# Patient Record
Sex: Male | Born: 1975 | Race: Black or African American | Hispanic: No | Marital: Married | Smoking: Former smoker
Health system: Southern US, Community
[De-identification: ages and names within clinical notes are randomized; demographics above are authoritative.]

## PROBLEM LIST (undated history)

## (undated) DIAGNOSIS — K219 Gastro-esophageal reflux disease without esophagitis: Secondary | ICD-10-CM

## (undated) HISTORY — PX: SHOULDER SURGERY: SHX246

## (undated) HISTORY — PX: BACK SURGERY: SHX140

---
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
## 1999-02-13 ENCOUNTER — Emergency Department (HOSPITAL_COMMUNITY): Admission: EM | Admit: 1999-02-13 | Discharge: 1999-02-13 | Payer: Self-pay | Admitting: Emergency Medicine

## 2000-11-13 ENCOUNTER — Encounter: Payer: Self-pay | Admitting: Family Medicine

## 2000-11-13 ENCOUNTER — Ambulatory Visit (HOSPITAL_COMMUNITY): Admission: RE | Admit: 2000-11-13 | Discharge: 2000-11-13 | Payer: Self-pay | Admitting: Family Medicine

## 2001-01-15 ENCOUNTER — Ambulatory Visit (HOSPITAL_COMMUNITY): Admission: RE | Admit: 2001-01-15 | Discharge: 2001-01-15 | Payer: Self-pay | Admitting: Cardiology

## 2001-06-15 ENCOUNTER — Emergency Department (HOSPITAL_COMMUNITY): Admission: EM | Admit: 2001-06-15 | Discharge: 2001-06-16 | Payer: Self-pay | Admitting: Emergency Medicine

## 2001-08-17 ENCOUNTER — Emergency Department (HOSPITAL_COMMUNITY): Admission: EM | Admit: 2001-08-17 | Discharge: 2001-08-18 | Payer: Self-pay

## 2001-08-18 ENCOUNTER — Encounter: Payer: Self-pay | Admitting: Emergency Medicine

## 2001-11-15 ENCOUNTER — Emergency Department (HOSPITAL_COMMUNITY): Admission: EM | Admit: 2001-11-15 | Discharge: 2001-11-16 | Payer: Self-pay | Admitting: *Deleted

## 2002-03-15 ENCOUNTER — Emergency Department (HOSPITAL_COMMUNITY): Admission: EM | Admit: 2002-03-15 | Discharge: 2002-03-16 | Payer: Self-pay | Admitting: Emergency Medicine

## 2002-03-15 ENCOUNTER — Encounter: Payer: Self-pay | Admitting: Emergency Medicine

## 2002-06-28 ENCOUNTER — Encounter: Payer: Self-pay | Admitting: Emergency Medicine

## 2002-06-28 ENCOUNTER — Emergency Department (HOSPITAL_COMMUNITY): Admission: EM | Admit: 2002-06-28 | Discharge: 2002-06-29 | Payer: Self-pay | Admitting: Emergency Medicine

## 2002-06-29 ENCOUNTER — Encounter: Payer: Self-pay | Admitting: Emergency Medicine

## 2002-07-14 ENCOUNTER — Emergency Department (HOSPITAL_COMMUNITY): Admission: EM | Admit: 2002-07-14 | Discharge: 2002-07-14 | Payer: Self-pay | Admitting: Emergency Medicine

## 2002-08-12 ENCOUNTER — Emergency Department (HOSPITAL_COMMUNITY): Admission: EM | Admit: 2002-08-12 | Discharge: 2002-08-12 | Payer: Self-pay | Admitting: Emergency Medicine

## 2004-07-16 ENCOUNTER — Emergency Department (HOSPITAL_COMMUNITY): Admission: EM | Admit: 2004-07-16 | Discharge: 2004-07-16 | Payer: Self-pay | Admitting: Emergency Medicine

## 2004-12-24 ENCOUNTER — Emergency Department (HOSPITAL_COMMUNITY): Admission: EM | Admit: 2004-12-24 | Discharge: 2004-12-24 | Payer: Self-pay | Admitting: Emergency Medicine

## 2005-05-06 ENCOUNTER — Emergency Department (HOSPITAL_COMMUNITY): Admission: EM | Admit: 2005-05-06 | Discharge: 2005-05-06 | Payer: Self-pay | Admitting: Emergency Medicine

## 2006-01-31 ENCOUNTER — Emergency Department (HOSPITAL_COMMUNITY): Admission: EM | Admit: 2006-01-31 | Discharge: 2006-01-31 | Payer: Self-pay | Admitting: Emergency Medicine

## 2006-12-31 ENCOUNTER — Emergency Department (HOSPITAL_COMMUNITY): Admission: EM | Admit: 2006-12-31 | Discharge: 2006-12-31 | Payer: Self-pay | Admitting: Emergency Medicine

## 2007-07-15 ENCOUNTER — Emergency Department (HOSPITAL_COMMUNITY): Admission: EM | Admit: 2007-07-15 | Discharge: 2007-07-15 | Payer: Self-pay | Admitting: Emergency Medicine

## 2008-05-02 ENCOUNTER — Emergency Department (HOSPITAL_COMMUNITY): Admission: EM | Admit: 2008-05-02 | Discharge: 2008-05-02 | Payer: Self-pay | Admitting: Emergency Medicine

## 2008-05-27 ENCOUNTER — Emergency Department (HOSPITAL_COMMUNITY): Admission: EM | Admit: 2008-05-27 | Discharge: 2008-05-27 | Payer: Self-pay | Admitting: Emergency Medicine

## 2009-05-02 ENCOUNTER — Emergency Department (HOSPITAL_COMMUNITY): Admission: EM | Admit: 2009-05-02 | Discharge: 2009-05-02 | Payer: Self-pay | Admitting: Emergency Medicine

## 2009-05-05 ENCOUNTER — Emergency Department (HOSPITAL_COMMUNITY): Admission: EM | Admit: 2009-05-05 | Discharge: 2009-05-05 | Payer: Self-pay | Admitting: Emergency Medicine

## 2009-07-02 ENCOUNTER — Emergency Department (HOSPITAL_COMMUNITY): Admission: EM | Admit: 2009-07-02 | Discharge: 2009-07-03 | Payer: Self-pay | Admitting: Emergency Medicine

## 2009-11-16 ENCOUNTER — Emergency Department (HOSPITAL_COMMUNITY): Admission: EM | Admit: 2009-11-16 | Discharge: 2009-11-16 | Payer: Self-pay | Admitting: Emergency Medicine

## 2010-04-12 ENCOUNTER — Emergency Department (HOSPITAL_COMMUNITY)
Admission: EM | Admit: 2010-04-12 | Discharge: 2010-04-12 | Payer: Self-pay | Source: Home / Self Care | Admitting: Emergency Medicine

## 2010-07-01 LAB — BASIC METABOLIC PANEL
BUN: 12 mg/dL (ref 6–23)
Creatinine, Ser: 1.16 mg/dL (ref 0.4–1.5)
GFR calc Af Amer: >60 mL/min (ref >60)
Glucose, Bld: 103 mg/dL — ABNORMAL HIGH (ref 70–99)
Potassium: 4.1 mEq/L (ref 3.5–5.1)
Sodium: 140 mEq/L (ref 135–145)

## 2010-07-01 LAB — POCT CARDIAC MARKERS
CKMB, poc: <1 ng/mL — ABNORMAL LOW (ref 1.0–8.0)
Myoglobin, poc: 102 ng/mL (ref 12–200)
Myoglobin, poc: 127 ng/mL (ref 12–200)

## 2010-07-01 LAB — PROTIME-INR
INR: 1.08 (ref 0.00–1.49)
Prothrombin Time: 13.9 seconds (ref 11.6–15.2)

## 2010-07-01 LAB — CBC
HCT: 43.5 % (ref 39.0–52.0)
Hemoglobin: 13.3 g/dL (ref 13.0–17.0)
Hemoglobin: 13.8 g/dL (ref 13.0–17.0)
MCHC: 31.6 g/dL (ref 30.0–36.0)
Platelets: 251 10*3/uL (ref 150–400)
RBC: 5.66 MIL/uL (ref 4.22–5.81)
RBC: 5.82 MIL/uL — ABNORMAL HIGH (ref 4.22–5.81)
WBC: 7.2 10*3/uL (ref 4.0–10.5)

## 2010-07-01 LAB — POCT I-STAT, CHEM 8
Creatinine, Ser: 1.2 mg/dL (ref 0.4–1.5)
HCT: 48 % (ref 39.0–52.0)
Hemoglobin: 16.3 g/dL (ref 13.0–17.0)
Potassium: 4 mEq/L (ref 3.5–5.1)
Sodium: 140 mEq/L (ref 135–145)
TCO2: 26 mmol/L (ref 0–100)

## 2010-07-01 LAB — DIFFERENTIAL
Basophils Absolute: 0 10*3/uL (ref 0.0–0.1)
Basophils Absolute: 0.1 10*3/uL (ref 0.0–0.1)
Eosinophils Absolute: 0.2 10*3/uL (ref 0.0–0.7)
Eosinophils Relative: 4 % (ref 0–5)
Lymphs Abs: 1.9 10*3/uL (ref 0.7–4.0)
Lymphs Abs: 2.7 10*3/uL (ref 0.7–4.0)
Monocytes Relative: 12 % (ref 3–12)

## 2010-07-01 LAB — COMPREHENSIVE METABOLIC PANEL
ALT: 30 U/L (ref 0–53)
Albumin: 3.9 g/dL (ref 3.5–5.2)
CO2: 27 mEq/L (ref 19–32)
Calcium: 8.9 mg/dL (ref 8.4–10.5)
Creatinine, Ser: 1.31 mg/dL (ref 0.4–1.5)
Glucose, Bld: 103 mg/dL — ABNORMAL HIGH (ref 70–99)
Potassium: 3.9 mEq/L (ref 3.5–5.1)
Total Protein: 7 g/dL (ref 6.0–8.3)

## 2010-07-01 LAB — APTT: aPTT: 26 seconds (ref 24–37)

## 2010-07-01 LAB — GLUCOSE, CAPILLARY: Glucose-Capillary: 111 mg/dL — ABNORMAL HIGH (ref 70–99)

## 2010-07-31 LAB — POCT CARDIAC MARKERS
Myoglobin, poc: 49.6 ng/mL (ref 12–200)
Myoglobin, poc: 69.2 ng/mL (ref 12–200)

## 2011-01-08 LAB — POCT CARDIAC MARKERS
CKMB, poc: <1 — ABNORMAL LOW
Myoglobin, poc: 63.8
Operator id: 270651

## 2011-01-08 LAB — POCT I-STAT, CHEM 8
Creatinine, Ser: 1.5
Glucose, Bld: 101 — ABNORMAL HIGH
HCT: 48
Hemoglobin: 16.3
Sodium: 140
TCO2: 27

## 2011-02-18 ENCOUNTER — Encounter (INDEPENDENT_AMBULATORY_CARE_PROVIDER_SITE_OTHER): Payer: Self-pay | Admitting: General Surgery

## 2011-02-18 ENCOUNTER — Ambulatory Visit (INDEPENDENT_AMBULATORY_CARE_PROVIDER_SITE_OTHER): Payer: PRIVATE HEALTH INSURANCE | Admitting: General Surgery

## 2011-02-18 VITALS — BP 144/82 | HR 76 | Temp 97.6°F | Resp 14 | Ht 72.0 in | Wt 328.6 lb

## 2011-02-18 DIAGNOSIS — L723 Sebaceous cyst: Secondary | ICD-10-CM

## 2011-02-18 DIAGNOSIS — L089 Local infection of the skin and subcutaneous tissue, unspecified: Secondary | ICD-10-CM | POA: Insufficient documentation

## 2011-02-18 NOTE — Progress Notes (Signed)
Subjective:     Patient ID: Anthony Chaney, male   DOB: [birth year], 35 y.o.   MRN: [id number]  HPI We are asked to see the patient in consultation by Dr. Velna Hatchet to evaluate him for an infected sebaceous cyst on his back. The patient is a 59 her black male who recently had a cyst on his back a very large. It started to drain multicolored fluid and material. It had a bad smell to it. He was treated with antibiotics which improved it but it has not completely gone away. He denies any fevers or chills. he is still having some drainage.  Review of Systems  Constitutional: Negative.   HENT: Negative.   Eyes: Negative.   Respiratory: Negative.   Cardiovascular: Negative.   Gastrointestinal: Negative.   Genitourinary: Negative.   Musculoskeletal: Negative.   Skin: Negative.   Neurological: Negative.   Hematological: Negative.   Psychiatric/Behavioral: Negative.    Past Medical History  Diagnosis Date  . Diabetes mellitus     type 2   Past Surgical History  Procedure Date  . Shoulder surgery 04/1996    left - arthro   Current Outpatient Prescriptions  Medication Sig Dispense Refill  . metFORMIN (GLUCOPHAGE) 500 MG tablet Take 500 mg by mouth daily.         Allergies  Allergen Reactions  . Sulfur Hives and Rash    All over the body  . Aspirin Other (See Comments)    Patient does not remember reaction.       Objective:   Physical Exam  Constitutional: He is oriented to person, place, and time. He appears well-developed and well-nourished.  HENT:  Head: Normocephalic and atraumatic.  Eyes: Conjunctivae and EOM are normal. Pupils are equal, round, and reactive to light.  Neck: Neck supple.  Cardiovascular: Normal rate, regular rhythm and normal heart sounds.   Pulmonary/Chest: Effort normal and breath sounds normal.       The patient has an obvious large sebaceous cyst on his back. There is no cellulitis today. It has been recently infected  Abdominal: Soft. Bowel  sounds are normal.  Musculoskeletal: Normal range of motion.  Neurological: He is alert and oriented to person, place, and time.  Skin: Skin is warm and dry.  Psychiatric: He has a normal mood and affect. His behavior is normal.       Assessment:     Recently infected sebaceous cyst on the back.    Plan:     I think the chance of this continuing to drain is pretty high. I think he would benefit from having this area incised and drained. I have discussed with him in detail the risks and benefits of the operation as well as some of the technical aspects and he understands and wishes to proceed. I will plan to leave the area open with packing and he can do dressing changes daily at home.

## 2011-02-21 ENCOUNTER — Other Ambulatory Visit (INDEPENDENT_AMBULATORY_CARE_PROVIDER_SITE_OTHER): Payer: Self-pay | Admitting: General Surgery

## 2011-02-22 DIAGNOSIS — L02219 Cutaneous abscess of trunk, unspecified: Secondary | ICD-10-CM

## 2011-02-22 DIAGNOSIS — L03319 Cellulitis of trunk, unspecified: Secondary | ICD-10-CM

## 2011-02-23 ENCOUNTER — Telehealth (INDEPENDENT_AMBULATORY_CARE_PROVIDER_SITE_OTHER): Payer: Self-pay | Admitting: General Surgery

## 2011-02-23 NOTE — Telephone Encounter (Signed)
I spoke with [name] about Anthony Chaney. He had incision and drainage of an abscess yesterday by Dr. Carolynne Edouard. They were told the nurses may come out today and change of dressing but they have not been called by the home health nurses. I told her if the nurses did not come out this weekend  to call Monday and we will do the first dressing change and I thought it was okay to do the first dressing change on Monday.

## 2011-03-25 ENCOUNTER — Ambulatory Visit (INDEPENDENT_AMBULATORY_CARE_PROVIDER_SITE_OTHER): Payer: PRIVATE HEALTH INSURANCE | Admitting: General Surgery

## 2011-03-25 ENCOUNTER — Encounter (INDEPENDENT_AMBULATORY_CARE_PROVIDER_SITE_OTHER): Payer: Self-pay | Admitting: General Surgery

## 2011-03-25 VITALS — BP 140/80 | HR 60 | Temp 98.2°F | Resp 12 | Ht 73.0 in | Wt 331.8 lb

## 2011-03-25 DIAGNOSIS — L723 Sebaceous cyst: Secondary | ICD-10-CM

## 2011-03-25 DIAGNOSIS — L089 Local infection of the skin and subcutaneous tissue, unspecified: Secondary | ICD-10-CM

## 2011-03-25 NOTE — Patient Instructions (Signed)
Continue daily shower and dressing change 

## 2011-03-25 NOTE — Progress Notes (Signed)
Subjective:     Patient ID: Anthony Chaney, male   DOB: [birth year], 35 y.o.   MRN: [id number]  HPI The patient is a 35 year old black male who is now one month out from an incision and drainage of an infected sebaceous cyst on the back. His family has been doing his dressing changes and things seemed to be doing well. He has no complaints today. He does not have any drainage. He's not having any pain.  Review of Systems     Objective:   Physical Exam On exam the wound is very clean and fairly shallow. There is good granulation tissue and no sign of infection    Assessment:     One month out from an incision and drainage of an infected sebaceous cyst on the back    Plan:     The patient will continue daily showering and dressing changes. We will see him back in about 3 weeks for a wound check

## 2011-04-06 ENCOUNTER — Encounter (HOSPITAL_BASED_OUTPATIENT_CLINIC_OR_DEPARTMENT_OTHER): Payer: Self-pay | Admitting: *Deleted

## 2011-04-06 ENCOUNTER — Emergency Department (HOSPITAL_BASED_OUTPATIENT_CLINIC_OR_DEPARTMENT_OTHER)
Admission: EM | Admit: 2011-04-06 | Discharge: 2011-04-07 | Disposition: A | Discharge status: Home / Self Care | Payer: PRIVATE HEALTH INSURANCE | Attending: Emergency Medicine | Admitting: Emergency Medicine

## 2011-04-06 DIAGNOSIS — R51 Headache: Secondary | ICD-10-CM | POA: Insufficient documentation

## 2011-04-06 DIAGNOSIS — E119 Type 2 diabetes mellitus without complications: Secondary | ICD-10-CM | POA: Insufficient documentation

## 2011-04-06 DIAGNOSIS — R5381 Other malaise: Secondary | ICD-10-CM | POA: Insufficient documentation

## 2011-04-06 DIAGNOSIS — J111 Influenza due to unidentified influenza virus with other respiratory manifestations: Secondary | ICD-10-CM | POA: Insufficient documentation

## 2011-04-06 HISTORY — DX: Gastro-esophageal reflux disease without esophagitis: K21.9

## 2011-04-06 NOTE — ED Notes (Signed)
Pt reports night sweats, fatigue, headache, sore throat x 5 days- denies cough- states blood sugar has not been running high

## 2011-04-07 MED ORDER — HYDROCODONE-ACETAMINOPHEN 5-325 MG PO TABS: 1.0000 | ORAL_TABLET | Freq: Four times a day (QID) | ORAL | Status: AC | PRN

## 2011-04-07 NOTE — ED Provider Notes (Addendum)
History   This chart was scribed for Hanley Seamen, MD scribed by Magnus Sinning. The patient was seen in room MH08/MH08    CSN: [id number]  Arrival date & time 04/06/11  2147   First MD Initiated Contact with Patient 04/07/11 0016      Chief Complaint  Patient presents with  . Headache  . Fatigue    (Consider location/radiation/quality/duration/timing/severity/associated sxs/prior treatment) HPI Anthony Chaney is a 35 y.o. male who presents to the Emergency Department complaining of 5 days of moderate headaches w/ associated neck pain, lethargy, sore throat, myalgias of his hips, nausea but no vomiting, diarrhea, otalgia of the right ear, runny nose, and night sweats onset 5 days ago. He describes his ST as the feeling of having a  "big ball in throat. Pt denies any fever, coughing or shortness of breath.    Past Medical History  Diagnosis Date  . Diabetes mellitus     type 2  . Acid reflux     Past Surgical History  Procedure Date  . Shoulder surgery 04/1996    left - arthro  . Shoulder surgery   . Back surgery     No family history on file.  History  Substance Use Topics  . Smoking status: Former Smoker    Quit date: 09/17/2009  . Smokeless tobacco: Never Used  . Alcohol Use: No     Review of Systems  Constitutional:       Lethargic feeling and night sweats  HENT: Positive for ear pain, sore throat, rhinorrhea and neck pain.   Gastrointestinal: Positive for nausea and diarrhea. Negative for vomiting.  Musculoskeletal:       Describes hips are achy   Neurological: Positive for headaches.  All other systems reviewed and are negative.    Allergies  Sulfur and Aspirin  Home Medications   Current Outpatient Rx  Name Route Sig Dispense Refill  . METFORMIN HCL 500 MG PO TABS Oral Take 500 mg by mouth daily.        BP 122/70  Pulse 70  Temp(Src) 98.2 F (36.8 C) (Oral)  Resp 20  Ht 6\' 1"  (1.854 m)  Wt 325 lb (147.419 kg)  BMI 42.88 kg/m2  SpO2  100%  Physical Exam  Nursing note and vitals reviewed. Constitutional: He is oriented to person, place, and time. He appears well-developed and well-nourished. No distress.  HENT:  Head: Normocephalic and atraumatic.  Right Ear: External ear normal.  Left Ear: External ear normal.  Eyes: Pupils are equal, round, and reactive to light.  Neck: Normal range of motion. Neck supple. No tracheal deviation present.  Cardiovascular: Normal rate, regular rhythm and intact distal pulses.   Pulmonary/Chest: Effort normal and breath sounds normal. No respiratory distress.  Abdominal: Soft. Normal appearance and bowel sounds are normal. He exhibits no distension.  Musculoskeletal: Normal range of motion.  Neurological: He is alert and oriented to person, place, and time. No cranial nerve deficit.  Skin: Skin is warm and dry.  Psychiatric: He has a normal mood and affect. His behavior is normal.    ED Course  Procedures (including critical care time) DIAGNOSTIC STUDIES: Oxygen Saturation is 100% on room air, normal by my interpretation.     MDM   Nursing notes and vitals signs, including pulse oximetry, reviewed.  Summary of this visit's results, reviewed by myself:  Labs:  Results for orders placed during the hospital encounter of 04/06/11  RAPID STREP SCREEN  Component Value Range   Streptococcus, Group A Screen (Direct) NEGATIVE  NEGATIVE     I personally performed the services described in this documentation, which was scribed in my presence.  The recorded information has been reviewed and considered.  1:32 AM Symptomatology consistent with viral syndrome. It is noted that the son, who accompanies him, tested positive for strep throat.         Hanley Seamen, MD 04/07/11 0126  Hanley Seamen, MD 04/07/11 [phone]

## 2011-04-15 ENCOUNTER — Encounter (INDEPENDENT_AMBULATORY_CARE_PROVIDER_SITE_OTHER): Payer: PRIVATE HEALTH INSURANCE | Admitting: General Surgery

## 2011-04-22 ENCOUNTER — Encounter (INDEPENDENT_AMBULATORY_CARE_PROVIDER_SITE_OTHER): Payer: Self-pay | Admitting: General Surgery

## 2011-04-23 ENCOUNTER — Emergency Department (HOSPITAL_COMMUNITY): Payer: PRIVATE HEALTH INSURANCE

## 2011-04-23 ENCOUNTER — Encounter (HOSPITAL_COMMUNITY): Payer: Self-pay

## 2011-04-23 ENCOUNTER — Emergency Department (HOSPITAL_COMMUNITY)
Admission: EM | Admit: 2011-04-23 | Discharge: 2011-04-23 | Disposition: A | Discharge status: Home / Self Care | Payer: PRIVATE HEALTH INSURANCE | Attending: Emergency Medicine | Admitting: Emergency Medicine

## 2011-04-23 DIAGNOSIS — R10819 Abdominal tenderness, unspecified site: Secondary | ICD-10-CM | POA: Insufficient documentation

## 2011-04-23 DIAGNOSIS — R319 Hematuria, unspecified: Secondary | ICD-10-CM | POA: Insufficient documentation

## 2011-04-23 DIAGNOSIS — E119 Type 2 diabetes mellitus without complications: Secondary | ICD-10-CM | POA: Insufficient documentation

## 2011-04-23 DIAGNOSIS — R110 Nausea: Secondary | ICD-10-CM | POA: Insufficient documentation

## 2011-04-23 DIAGNOSIS — K219 Gastro-esophageal reflux disease without esophagitis: Secondary | ICD-10-CM | POA: Insufficient documentation

## 2011-04-23 DIAGNOSIS — N390 Urinary tract infection, site not specified: Secondary | ICD-10-CM | POA: Insufficient documentation

## 2011-04-23 DIAGNOSIS — R350 Frequency of micturition: Secondary | ICD-10-CM | POA: Insufficient documentation

## 2011-04-23 DIAGNOSIS — R109 Unspecified abdominal pain: Secondary | ICD-10-CM | POA: Insufficient documentation

## 2011-04-23 LAB — URINE CULTURE: Culture  Setup Time: 201301081339

## 2011-04-23 LAB — URINE MICROSCOPIC-ADD ON

## 2011-04-23 LAB — URINALYSIS, ROUTINE W REFLEX MICROSCOPIC
Glucose, UA: NEGATIVE mg/dL
Ketones, ur: NEGATIVE mg/dL
Protein, ur: 100 mg/dL — AB

## 2011-04-23 LAB — POCT I-STAT, CHEM 8
BUN: 8 mg/dL (ref 6–23)
Calcium, Ion: 1.23 mmol/L (ref 1.12–1.32)
Creatinine, Ser: 1.2 mg/dL (ref 0.50–1.35)
Glucose, Bld: 90 mg/dL (ref 70–99)
TCO2: 29 mmol/L (ref 0–100)

## 2011-04-23 MED ORDER — OXYCODONE-ACETAMINOPHEN 5-325 MG PO TABS
ORAL_TABLET | ORAL | Status: AC
  Filled 2011-04-23: qty 1

## 2011-04-23 MED ORDER — PHENAZOPYRIDINE HCL 200 MG PO TABS: 200.0000 mg | ORAL_TABLET | Freq: Three times a day (TID) | ORAL | Status: AC

## 2011-04-23 MED ORDER — OXYCODONE-ACETAMINOPHEN 5-325 MG PO TABS: 1.0000 | ORAL_TABLET | Freq: Once | ORAL | Status: AC

## 2011-04-23 MED ORDER — CIPROFLOXACIN HCL 500 MG PO TABS: 500.0000 mg | ORAL_TABLET | Freq: Two times a day (BID) | ORAL | Status: AC

## 2011-04-23 MED ORDER — OXYCODONE-ACETAMINOPHEN 5-325 MG PO TABS
1.0000 | ORAL_TABLET | Freq: Once | ORAL | Status: AC
  Administered 2011-04-23: 1 via ORAL

## 2011-04-23 MED ORDER — HYDROCODONE-ACETAMINOPHEN 5-325 MG PO TABS: 1.0000 | ORAL_TABLET | Freq: Four times a day (QID) | ORAL | Status: DC | PRN

## 2011-04-23 MED ORDER — FLUCONAZOLE 150 MG PO TABS
150.0000 mg | ORAL_TABLET | Freq: Once | ORAL | Status: AC
  Administered 2011-04-23: 150 mg via ORAL
  Filled 2011-04-23 (×2): qty 1

## 2011-04-23 NOTE — ED Provider Notes (Signed)
Medical screening examination/treatment/procedure(s) were performed by non-physician practitioner and as supervising physician I was immediately available for consultation/collaboration.   Isabella Roemmich, MD 04/23/11 2045 

## 2011-04-23 NOTE — ED Provider Notes (Signed)
History     CSN: [id number]  Arrival date & time 04/23/11  1151   First MD Initiated Contact with Patient 04/23/11 1239      Chief Complaint  Patient presents with  . Hematuria    (Consider location/radiation/quality/duration/timing/severity/associated sxs/prior treatment) HPI Comments: Patient reports painful hematuria, urinary frequency, and left flank pain that began 2 days ago.  Associated nausea without vomiting. States the pain with urination is in his penis.  Pain in his left flank occasionally radiates to right side of back.  Denies fevers, other abnormal discharge from the penis, testicular swelling or pain, rectal pain.  Pt has a hx of UTI, states this is much worse.  Denies hx kidney stones.  Has never seen a urologist.    Patient is a 36 y.o. male presenting with hematuria. The history is provided by the patient and the spouse.  Hematuria The current episode started in the past 7 days. He describes the hematuria as gross hematuria. Irritative symptoms include frequency.    Past Medical History  Diagnosis Date  . Diabetes mellitus     type 2  . Acid reflux     Past Surgical History  Procedure Date  . Shoulder surgery 04/1996    left - arthro  . Shoulder surgery   . Back surgery     No family history on file.  History  Substance Use Topics  . Smoking status: Former Smoker    Quit date: 09/17/2009  . Smokeless tobacco: Never Used  . Alcohol Use: No      Review of Systems  Genitourinary: Positive for frequency and hematuria.  All other systems reviewed and are negative.    Allergies  Sulfur and Aspirin  Home Medications   Current Outpatient Rx  Name Route Sig Dispense Refill  . IBUPROFEN 200 MG PO TABS Oral Take 600 mg by mouth every 6 (six) hours as needed. For headache     . METFORMIN HCL 500 MG PO TABS Oral Take 500 mg by mouth daily.        BP 128/76  Pulse 87  Temp(Src) 97.8 F (36.6 C) (Oral)  Resp 20  Ht 6\' 1"  (1.854 m)  Wt 328 lb  (148.78 kg)  BMI 43.27 kg/m2  SpO2 99%  Physical Exam  Nursing note and vitals reviewed. Constitutional: He is oriented to person, place, and time. He appears well-developed and well-nourished.  HENT:  Head: Normocephalic and atraumatic.  Neck: Neck supple.  Cardiovascular: Normal rate and regular rhythm.   Pulmonary/Chest: Effort normal and breath sounds normal.  Abdominal: Soft. He exhibits no distension and no mass. There is tenderness in the suprapubic area. There is no rebound, no guarding and no CVA tenderness.  Neurological: He is alert and oriented to person, place, and time.    ED Course  Procedures (including critical care time)  Labs Reviewed  URINALYSIS, ROUTINE W REFLEX MICROSCOPIC - Abnormal; Notable for the following:    Color, Urine AMBER (*) BIOCHEMICALS MAY BE AFFECTED BY COLOR   APPearance CLOUDY (*)    Hgb urine dipstick LARGE (*)    Protein, ur 100 (*)    Leukocytes, UA LARGE (*)    All other components within normal limits  URINE MICROSCOPIC-ADD ON - Abnormal; Notable for the following:    Bacteria, UA FEW (*)    All other components within normal limits  POCT I-STAT, CHEM 8  URINE CULTURE  I-STAT, CHEM 8   Ct Abdomen Pelvis Wo Contrast  04/23/2011  *  RADIOLOGY REPORT*  Clinical Data: Left flank pain and hematuria.  CT ABDOMEN AND PELVIS WITHOUT CONTRAST  Technique:  Multidetector CT imaging of the abdomen and pelvis was performed following the standard protocol without intravenous contrast.  Comparison: None  Findings: The lung bases are clear.  No pleural effusion.  The unenhanced appearance of the liver and spleen are unremarkable. The gallbladder is normal.  No common bile duct dilatation.  The pancreas, adrenal glands and kidneys are normal.  No renal or obstructing ureteral calculi.  No hydronephrosis.  No bladder calculi.  The stomach, duodenum, small bowel and colon are unremarkable without oral contrast.  The appendix is normal.  No mesenteric or  retroperitoneal masses or adenopathy.  The aorta is normal in caliber.  The bladder, prostate gland and seminal vesicles are unremarkable. No pelvic mass, adenopathy or free pelvic fluid collections.  No inguinal mass or hernia.  The bony structures are unremarkable.  IMPRESSION:  1.  No acute abdominal/pelvic findings, mass lesions or adenopathy. 2.  No renal or obstructing ureteral calculi.  Original Report Authenticated By: P. Loralie Champagne, M.D.    12:53 PM Patient seen and examined, declines pain medication at this time.  Pt with painful hematuria + left flank pain.  ?UTI vs ureteral stone.    1. UTI (lower urinary tract infection)   2. Hematuria       MDM  Patient with painful hematuria and left flank pain without CVA tenderness or fever.  CT shows normal kidneys, no ureteral stones or other abnormalities.  Patient's pain treated in ED.  D/C home with urology follow up, cipro x 2 weeks, pyridium, vicodin for pain.          Rise Patience, [place] 04/23/11 1454

## 2011-04-23 NOTE — ED Notes (Signed)
Returned from ct 

## 2011-04-23 NOTE — ED Notes (Signed)
Hematuria with pain began on Sunday

## 2012-02-17 ENCOUNTER — Encounter (HOSPITAL_COMMUNITY): Payer: Self-pay | Admitting: Emergency Medicine

## 2012-02-17 ENCOUNTER — Emergency Department (HOSPITAL_COMMUNITY)
Admission: EM | Admit: 2012-02-17 | Discharge: 2012-02-17 | Disposition: A | Discharge status: Home / Self Care | Payer: Self-pay | Attending: Emergency Medicine | Admitting: Emergency Medicine

## 2012-02-17 DIAGNOSIS — M2550 Pain in unspecified joint: Secondary | ICD-10-CM | POA: Insufficient documentation

## 2012-02-17 DIAGNOSIS — Z87891 Personal history of nicotine dependence: Secondary | ICD-10-CM | POA: Insufficient documentation

## 2012-02-17 NOTE — ED Notes (Signed)
Pt presenting to ed with c/o joint pain all over extremities x 1 week. Pt states he's been using ibuprofen and linoment for horses.

## 2012-04-19 IMAGING — CT CT ABD-PELV W/O CM
2 of 4 series · 14 of 32 positions shown, 19 images · non-contrast
Comparison: None

CLINICAL DATA: Left flank pain and hematuria.

CT ABDOMEN AND PELVIS WITHOUT CONTRAST
TECHNIQUE: Multidetector CT imaging of the abdomen and pelvis was
performed following the standard protocol without intravenous
contrast.

[Series 2: renal stone · axial · 0.92mm/px · z∈[-365,-55]mm · 6 of 87 slices shown, 11 images]
[im 13/87  soft-tissue]
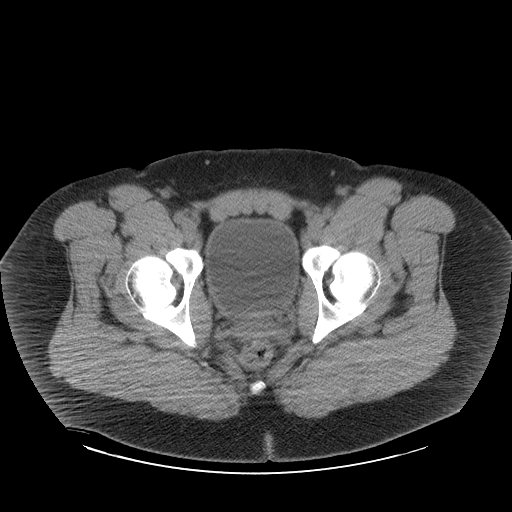
[im 13/87  bone]
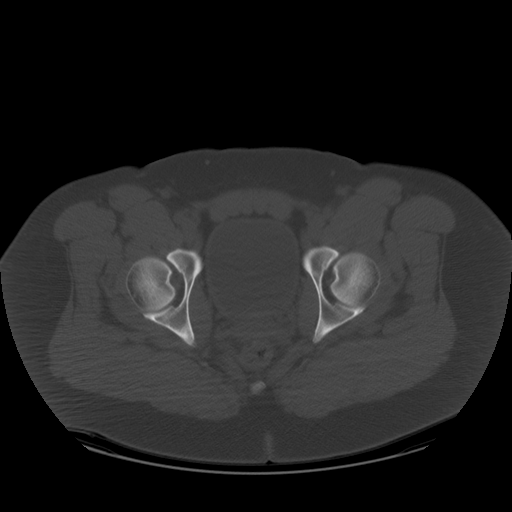
[im 25/87  soft-tissue]
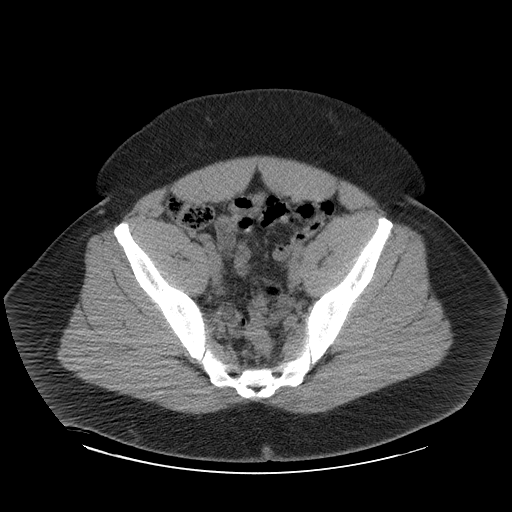
[im 37/87  soft-tissue]
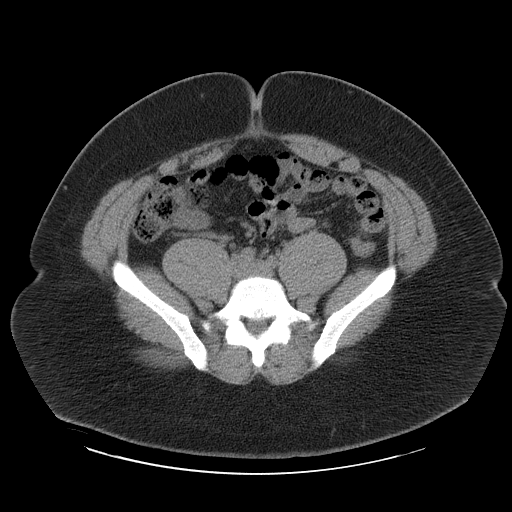
[im 37/87  lung]
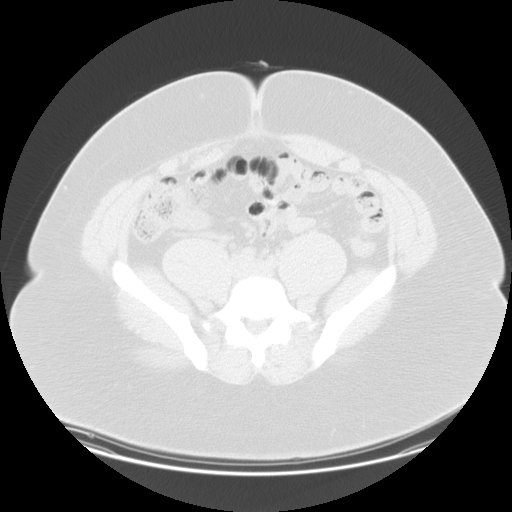
[im 50/87  soft-tissue]
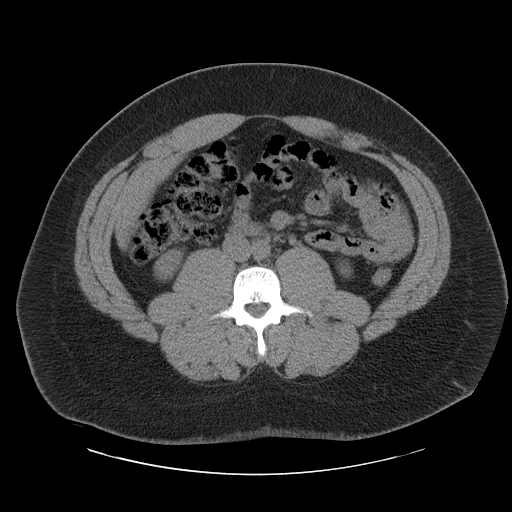
[im 50/87  lung]
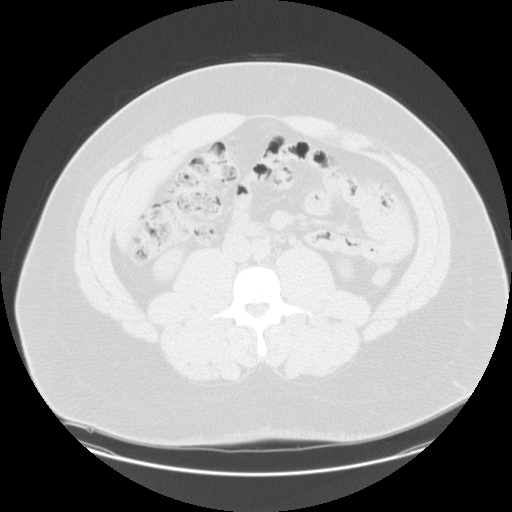
[im 62/87  soft-tissue]
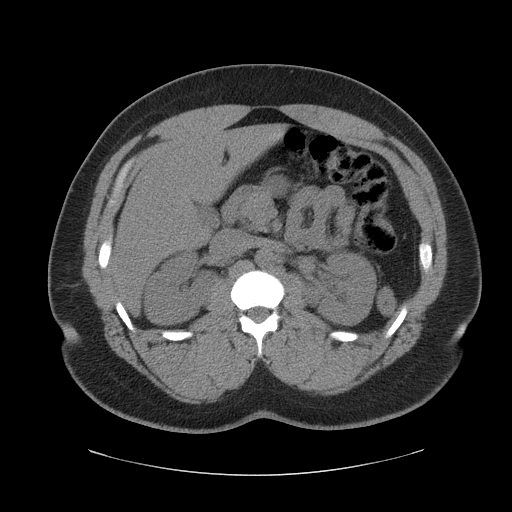
[im 62/87  lung]
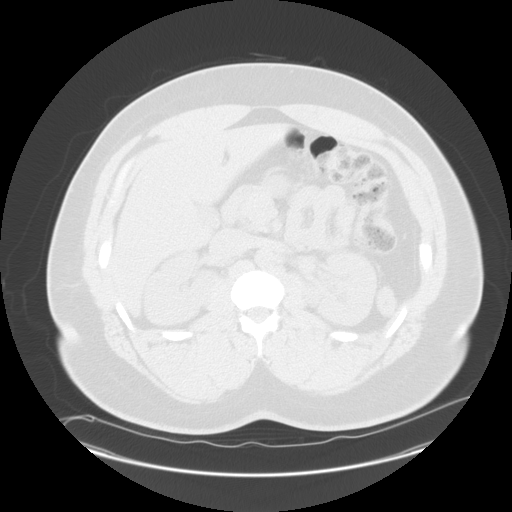
[im 74/87  soft-tissue]
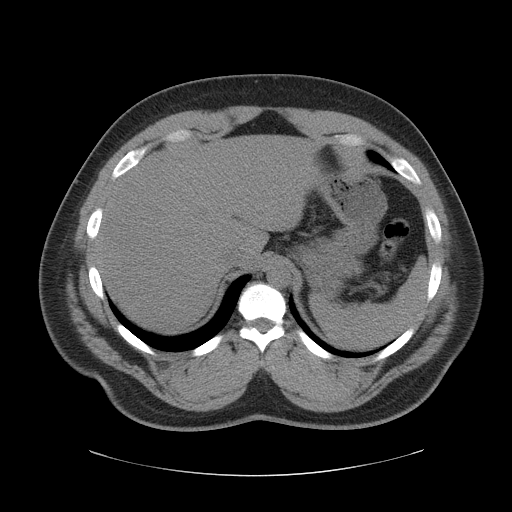
[im 74/87  lung]
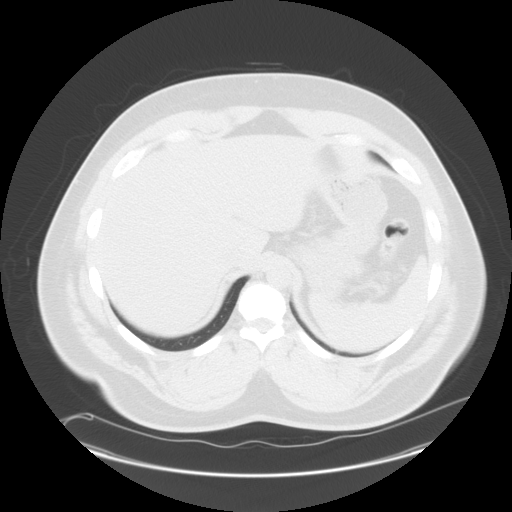

[Series 400: sagittals · sagittal · 0.92mm/px · 8 of 136 slices shown]
[im 14/136  soft-tissue]
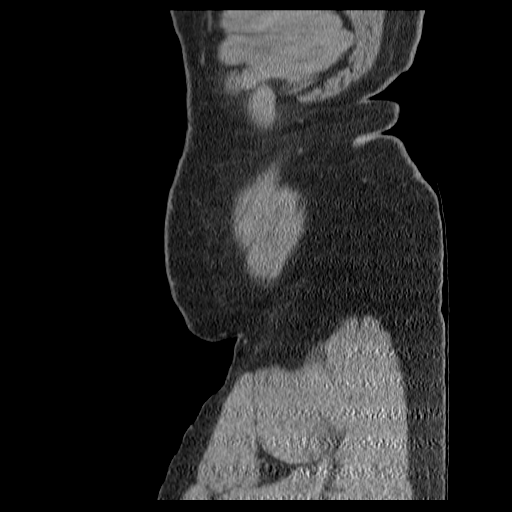
[im 28/136  soft-tissue]
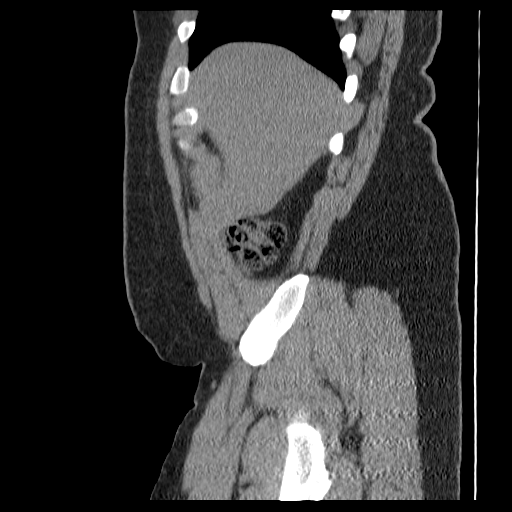
[im 41/136  soft-tissue]
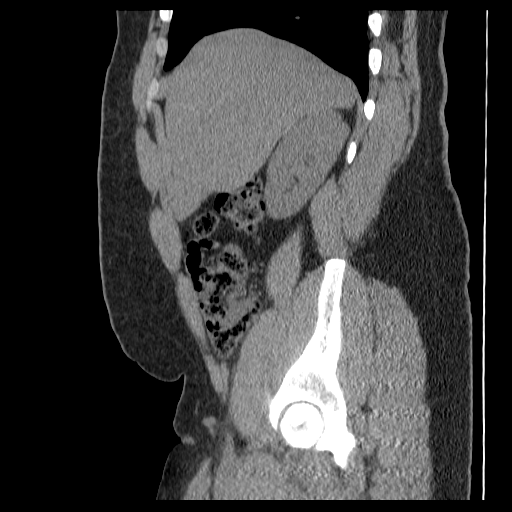
[im 55/136  soft-tissue]
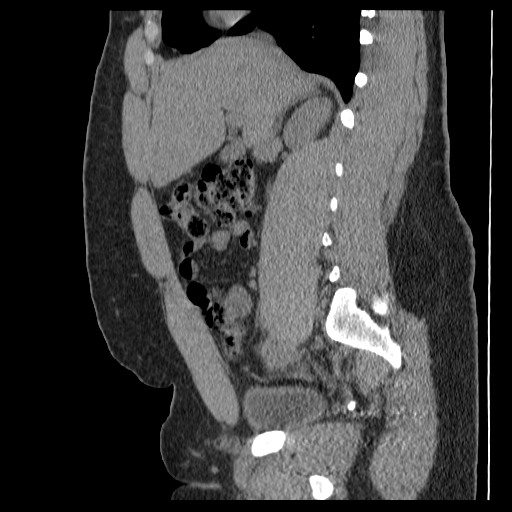
[im 82/136  soft-tissue]
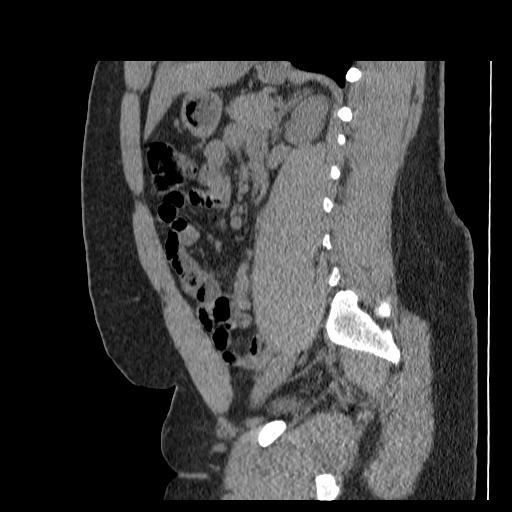
[im 95/136  soft-tissue]
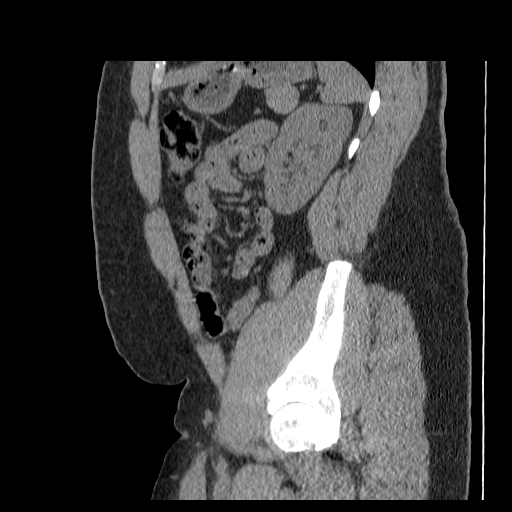
[im 109/136  soft-tissue]
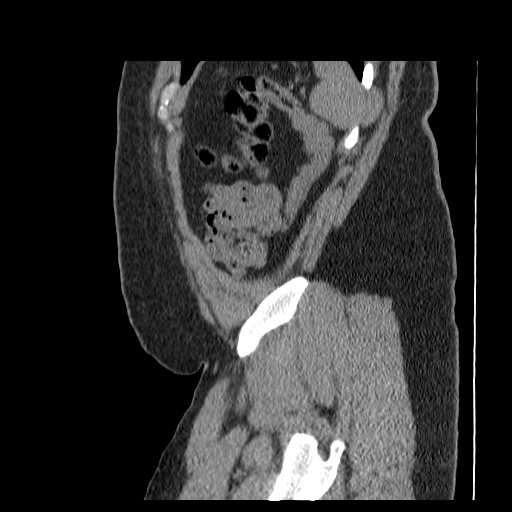
[im 122/136  soft-tissue]
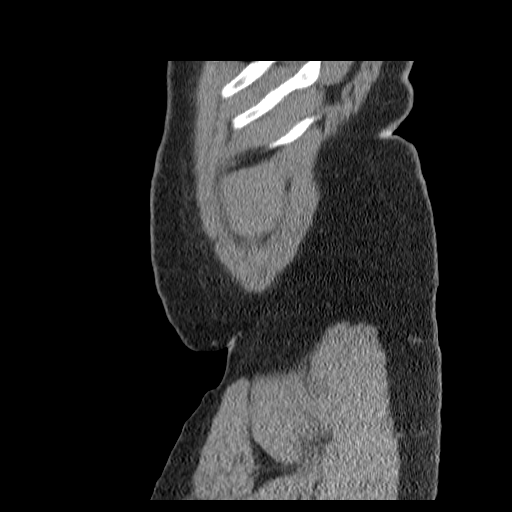

[14 of 32 positions shown; findings below may reference images not displayed]

FINDINGS: The lung bases are clear.  No pleural effusion.

The unenhanced appearance of the liver and spleen are unremarkable.
The gallbladder is normal.  No common bile duct dilatation.  The
pancreas, adrenal glands and kidneys are normal.  No renal or
obstructing ureteral calculi.  No hydronephrosis.  No bladder
calculi.

The stomach, duodenum, small bowel and colon are unremarkable
without oral contrast.  The appendix is normal.  No mesenteric or
retroperitoneal masses or adenopathy.  The aorta is normal in
caliber.

The bladder, prostate gland and seminal vesicles are unremarkable.
No pelvic mass, adenopathy or free pelvic fluid collections.  No
inguinal mass or hernia.  The bony structures are unremarkable.
IMPRESSION: 1.  No acute abdominal/pelvic findings, mass lesions or adenopathy.
2.  No renal or obstructing ureteral calculi.

## 2017-02-15 ENCOUNTER — Ambulatory Visit (HOSPITAL_COMMUNITY)
Admission: EM | Admit: 2017-02-15 | Discharge: 2017-02-16 | Disposition: A | Discharge status: Home / Self Care | Payer: 59 | Attending: Emergency Medicine | Admitting: Emergency Medicine

## 2017-02-15 ENCOUNTER — Emergency Department (HOSPITAL_COMMUNITY): Payer: 59

## 2017-02-15 ENCOUNTER — Encounter (HOSPITAL_COMMUNITY): Payer: Self-pay | Admitting: *Deleted

## 2017-02-15 ENCOUNTER — Encounter (HOSPITAL_COMMUNITY)
Admission: EM | Disposition: A | Discharge status: Home / Self Care | Payer: Self-pay | Source: Home / Self Care | Attending: Emergency Medicine

## 2017-02-15 DIAGNOSIS — Z87891 Personal history of nicotine dependence: Secondary | ICD-10-CM | POA: Diagnosis not present

## 2017-02-15 DIAGNOSIS — Y998 Other external cause status: Secondary | ICD-10-CM | POA: Insufficient documentation

## 2017-02-15 DIAGNOSIS — Z7984 Long term (current) use of oral hypoglycemic drugs: Secondary | ICD-10-CM | POA: Diagnosis not present

## 2017-02-15 DIAGNOSIS — E119 Type 2 diabetes mellitus without complications: Secondary | ICD-10-CM | POA: Diagnosis not present

## 2017-02-15 DIAGNOSIS — K259 Gastric ulcer, unspecified as acute or chronic, without hemorrhage or perforation: Secondary | ICD-10-CM | POA: Insufficient documentation

## 2017-02-15 DIAGNOSIS — S6402XA Injury of ulnar nerve at wrist and hand level of left arm, initial encounter: Secondary | ICD-10-CM | POA: Insufficient documentation

## 2017-02-15 DIAGNOSIS — K219 Gastro-esophageal reflux disease without esophagitis: Secondary | ICD-10-CM | POA: Insufficient documentation

## 2017-02-15 DIAGNOSIS — S61412A Laceration without foreign body of left hand, initial encounter: Secondary | ICD-10-CM | POA: Diagnosis not present

## 2017-02-15 DIAGNOSIS — Y9389 Activity, other specified: Secondary | ICD-10-CM | POA: Insufficient documentation

## 2017-02-15 HISTORY — PX: I & D EXTREMITY: SHX5045

## 2017-02-15 SURGERY — IRRIGATION AND DEBRIDEMENT EXTREMITY
Anesthesia: General | Site: Hand | Laterality: Left

## 2017-02-15 MED ORDER — FENTANYL CITRATE (PF) 250 MCG/5ML IJ SOLN
INTRAMUSCULAR | Status: AC
  Filled 2017-02-15: qty 5

## 2017-02-15 MED ORDER — MIDAZOLAM HCL 2 MG/2ML IJ SOLN
INTRAMUSCULAR | Status: AC
  Filled 2017-02-15: qty 2

## 2017-02-15 MED ORDER — LIDOCAINE-EPINEPHRINE 2 %-1:100000 IJ SOLN
20.0000 mL | Freq: Once | INTRAMUSCULAR | Status: AC
  Administered 2017-02-15: 1 mL
  Filled 2017-02-15: qty 20

## 2017-02-15 MED ORDER — PROPOFOL 10 MG/ML IV BOLUS
INTRAVENOUS | Status: AC
  Filled 2017-02-15: qty 40

## 2017-02-15 MED ORDER — SODIUM CHLORIDE 0.9 % IV BOLUS (SEPSIS): 1000.0000 mL | Freq: Once | INTRAVENOUS | Status: DC

## 2017-02-15 MED ORDER — OXYCODONE-ACETAMINOPHEN 5-325 MG PO TABS
1.0000 | ORAL_TABLET | Freq: Once | ORAL | Status: AC
Start: 2017-02-15 — End: 2017-02-15
  Administered 2017-02-15: 1 via ORAL
  Filled 2017-02-15: qty 1

## 2017-02-15 MED ORDER — MORPHINE SULFATE (PF) 4 MG/ML IV SOLN
4.0000 mg | Freq: Once | INTRAVENOUS | Status: AC
  Administered 2017-02-15: 4 mg via INTRAVENOUS
  Filled 2017-02-15: qty 1

## 2017-02-15 SURGICAL SUPPLY — 39 items
BANDAGE ACE 4X5 VEL STRL LF (GAUZE/BANDAGES/DRESSINGS) ×3 IMPLANT
BANDAGE ELASTIC 3 VELCRO ST LF (GAUZE/BANDAGES/DRESSINGS) ×3 IMPLANT
BNDG CONFORM 3 STRL LF (GAUZE/BANDAGES/DRESSINGS) ×3 IMPLANT
BNDG GAUZE ELAST 4 BULKY (GAUZE/BANDAGES/DRESSINGS) ×3 IMPLANT
CORDS BIPOLAR (ELECTRODE) ×3 IMPLANT
COVER SURGICAL LIGHT HANDLE (MISCELLANEOUS) ×3 IMPLANT
CUFF TOURNIQUET SINGLE 24IN (TOURNIQUET CUFF) ×3 IMPLANT
DRSG ADAPTIC 3X8 NADH LF (GAUZE/BANDAGES/DRESSINGS) ×3 IMPLANT
GAUZE SPONGE 4X4 12PLY STRL (GAUZE/BANDAGES/DRESSINGS) ×3 IMPLANT
GAUZE XEROFORM 5X9 LF (GAUZE/BANDAGES/DRESSINGS) ×3 IMPLANT
GLOVE BIOGEL PI IND STRL 7.0 (GLOVE) ×1 IMPLANT
GLOVE BIOGEL PI INDICATOR 7.0 (GLOVE) ×2
GLOVE SS BIOGEL STRL SZ 8 (GLOVE) ×1 IMPLANT
GLOVE SUPERSENSE BIOGEL SZ 8 (GLOVE) ×2
GLOVE SURG SS PI 7.0 STRL IVOR (GLOVE) ×3 IMPLANT
GOWN STRL REUS W/ TWL LRG LVL3 (GOWN DISPOSABLE) ×1 IMPLANT
GOWN STRL REUS W/ TWL XL LVL3 (GOWN DISPOSABLE) ×1 IMPLANT
GOWN STRL REUS W/TWL LRG LVL3 (GOWN DISPOSABLE) ×2
GOWN STRL REUS W/TWL XL LVL3 (GOWN DISPOSABLE) ×2
GUIDE NERVE NEURAGEN 1.5MM (Tissue) ×3 IMPLANT
KIT BASIN OR (CUSTOM PROCEDURE TRAY) ×3 IMPLANT
KIT ROOM TURNOVER OR (KITS) ×3 IMPLANT
MANIFOLD NEPTUNE II (INSTRUMENTS) ×3 IMPLANT
NEEDLE HYPO 25GX1X1/2 BEV (NEEDLE) IMPLANT
NS IRRIG 1000ML POUR BTL (IV SOLUTION) ×3 IMPLANT
PACK ORTHO EXTREMITY (CUSTOM PROCEDURE TRAY) ×3 IMPLANT
PAD ARMBOARD 7.5X6 YLW CONV (MISCELLANEOUS) ×3 IMPLANT
PAD CAST 4YDX4 CTTN HI CHSV (CAST SUPPLIES) ×1 IMPLANT
PADDING CAST COTTON 4X4 STRL (CAST SUPPLIES) ×2
SCRUB BETADINE 4OZ XXX (MISCELLANEOUS) ×3 IMPLANT
SOL PREP POV-IOD 4OZ 10% (MISCELLANEOUS) ×3 IMPLANT
SPLINT FIBERGLASS 4X30 (CAST SUPPLIES) ×3 IMPLANT
SPONGE LAP 4X18 X RAY DECT (DISPOSABLE) ×3 IMPLANT
SUT ETHILON 8 0 TG100 8 (SUTURE) ×3 IMPLANT
SUT PROLENE 4 0 PS 2 18 (SUTURE) ×6 IMPLANT
TOWEL OR 17X26 10 PK STRL BLUE (TOWEL DISPOSABLE) ×3 IMPLANT
TUBE CONNECTING 12'X1/4 (SUCTIONS) ×1
TUBE CONNECTING 12X1/4 (SUCTIONS) ×2 IMPLANT
YANKAUER SUCT BULB TIP NO VENT (SUCTIONS) ×3 IMPLANT

## 2017-02-15 NOTE — ED Notes (Signed)
Pt being taking to Xray by transport

## 2017-02-15 NOTE — ED Notes (Signed)
ED provider returned to bedside for further eval. [facility]

## 2017-02-15 NOTE — ED Notes (Signed)
ED Provider at bedside for hand repair

## 2017-02-15 NOTE — ED Notes (Signed)
Patient transported to X-ray 
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
## 2017-02-16 ENCOUNTER — Encounter (HOSPITAL_COMMUNITY): Payer: Self-pay | Admitting: Anesthesiology

## 2017-02-16 ENCOUNTER — Emergency Department (HOSPITAL_COMMUNITY): Payer: 59 | Admitting: Anesthesiology

## 2017-02-16 LAB — GLUCOSE, CAPILLARY: Glucose-Capillary: 140 mg/dL — ABNORMAL HIGH (ref 65–99)

## 2017-02-16 MED ORDER — FENTANYL CITRATE (PF) 100 MCG/2ML IJ SOLN
INTRAMUSCULAR | Status: AC
  Filled 2017-02-16: qty 2

## 2017-02-16 MED ORDER — PROPOFOL 10 MG/ML IV BOLUS
INTRAVENOUS | Status: DC | PRN
  Administered 2017-02-16: 200 mg via INTRAVENOUS
  Administered 2017-02-16: 80 mg via INTRAVENOUS

## 2017-02-16 MED ORDER — FENTANYL CITRATE (PF) 100 MCG/2ML IJ SOLN
INTRAMUSCULAR | Status: DC | PRN
  Administered 2017-02-16: 50 ug via INTRAVENOUS
  Administered 2017-02-16: 100 ug via INTRAVENOUS
  Administered 2017-02-16: 50 ug via INTRAVENOUS

## 2017-02-16 MED ORDER — LACTATED RINGERS IV SOLN
INTRAVENOUS | Status: DC | PRN
  Administered 2017-02-16: via INTRAVENOUS

## 2017-02-16 MED ORDER — MIDAZOLAM HCL 5 MG/5ML IJ SOLN
INTRAMUSCULAR | Status: DC | PRN
  Administered 2017-02-16 (×2): 1 mg via INTRAVENOUS

## 2017-02-16 MED ORDER — CEFAZOLIN SODIUM-DEXTROSE 2-3 GM-%(50ML) IV SOLR
INTRAVENOUS | Status: DC | PRN
  Administered 2017-02-16: 2 g via INTRAVENOUS

## 2017-02-16 MED ORDER — SUCCINYLCHOLINE CHLORIDE 20 MG/ML IJ SOLN
INTRAMUSCULAR | Status: DC | PRN
  Administered 2017-02-16: 140 mg via INTRAVENOUS

## 2017-02-16 MED ORDER — SODIUM CHLORIDE 0.9 % IR SOLN
Status: DC | PRN
  Administered 2017-02-16: 1

## 2017-02-16 MED ORDER — LIDOCAINE HCL (CARDIAC) 20 MG/ML IV SOLN
INTRAVENOUS | Status: DC | PRN
Start: 2017-02-16 — End: 2017-02-16
  Administered 2017-02-16: 60 mg via INTRAVENOUS

## 2017-02-16 MED ORDER — FENTANYL CITRATE (PF) 100 MCG/2ML IJ SOLN
25.0000 ug | INTRAMUSCULAR | Status: DC | PRN
  Administered 2017-02-16: 50 ug via INTRAVENOUS

## 2017-02-16 MED ORDER — ONDANSETRON HCL 4 MG/2ML IJ SOLN: 4.0000 mg | Freq: Four times a day (QID) | INTRAMUSCULAR | Status: DC | PRN

## 2017-02-16 MED ORDER — DEXAMETHASONE SODIUM PHOSPHATE 10 MG/ML IJ SOLN
INTRAMUSCULAR | Status: AC
  Filled 2017-02-16: qty 1

## 2017-02-16 MED ORDER — OXYCODONE HCL 5 MG/5ML PO SOLN: 5.0000 mg | Freq: Once | ORAL | Status: AC | PRN

## 2017-02-16 MED ORDER — OXYCODONE HCL 5 MG PO TABS
ORAL_TABLET | ORAL | Status: AC
  Filled 2017-02-16: qty 1

## 2017-02-16 MED ORDER — OXYCODONE HCL 5 MG PO TABS
5.0000 mg | ORAL_TABLET | Freq: Once | ORAL | Status: AC | PRN
  Administered 2017-02-16: 5 mg via ORAL

## 2017-02-16 MED ORDER — ONDANSETRON HCL 4 MG/2ML IJ SOLN
INTRAMUSCULAR | Status: DC | PRN
  Administered 2017-02-16: 4 mg via INTRAVENOUS

## 2017-02-16 MED ORDER — DEXAMETHASONE SODIUM PHOSPHATE 10 MG/ML IJ SOLN
INTRAMUSCULAR | Status: DC | PRN
  Administered 2017-02-16: 10 mg via INTRAVENOUS

## 2017-02-16 MED ORDER — SUCCINYLCHOLINE CHLORIDE 200 MG/10ML IV SOSY
PREFILLED_SYRINGE | INTRAVENOUS | Status: AC
  Filled 2017-02-16: qty 10

## 2017-02-16 MED ORDER — ONDANSETRON HCL 4 MG/2ML IJ SOLN
INTRAMUSCULAR | Status: AC
  Filled 2017-02-16: qty 2

## 2017-02-16 MED ORDER — LIDOCAINE 2% (20 MG/ML) 5 ML SYRINGE
INTRAMUSCULAR | Status: AC
  Filled 2017-02-16: qty 5

## 2017-02-16 NOTE — Progress Notes (Signed)
Late note due to time change. Please note that 0140 is really 0240 with daylight savings time. Pt given IV Fentanyl for pain  and kept kept 45 mins after dose prior to discharge. Discussed pt with Dr. Chaney MallingHodierne and was cleared for discharge. Pain 4-5/10. Iv discontinued, pt voided , tolerated pos well.  and sent home with family. Progress note also noted on paper

## 2017-02-16 NOTE — Discharge Instructions (Signed)

## 2017-02-16 NOTE — Progress Notes (Signed)
Pt awake alert, pain free. Good CMS to left hand. Home instructions to wife and patient. Tolerating oral fluids well. Will dc IV prior to adm. Due to time change EPIC will be down at 0145. Will finish dc note on progress note paper.

## 2017-02-16 NOTE — Transfer of Care (Signed)
Immediate Anesthesia Transfer of Care Note  Patient: Anthony K Middlekauff Sr.  Procedure(s) Performed: IRRIGATION AND DEBRIDEMENT EXTREMITY, AND REPAIR, LACERATION LEFT HAND (Left )  Patient Location: PACU  Anesthesia Type:General  Level of Consciousness: awake  Airway & Oxygen Therapy: Patient Spontanous Breathing and Patient connected to nasal cannula oxygen  Post-op Assessment: Report given to RN, Post -op Vital signs reviewed and stable and Patient moving all extremities X 4  Post vital signs: Reviewed and stable  Last Vitals:  Vitals:   02/15/17 2300 02/16/17 0126  BP: (!) 157/107 (P) 126/76  Pulse: 81   Resp:  (P) 19  Temp:  (P) 36.9 C  SpO2: 94%     Last Pain:  Vitals:   02/15/17 2247  TempSrc:   PainSc: 7          Complications: No apparent anesthesia complications

## 2017-02-16 NOTE — Anesthesia Preprocedure Evaluation (Addendum)
Anesthesia Evaluation  Patient identified by MRN, date of birth, ID band Patient awake    Reviewed: Allergy & Precautions, H&P , NPO status , Patient's Chart, lab work & pertinent test results  Airway Mallampati: II  TM Distance: >3 FB Neck ROM: Full    Dental  (+) Teeth Intact, Dental Advisory Given   Pulmonary former smoker,    breath sounds clear to auscultation       Cardiovascular negative cardio ROS   Rhythm:regular Rate:Normal     Neuro/Psych    GI/Hepatic GERD  ,  Endo/Other  diabetes, Type obesity  Renal/GU      Musculoskeletal   Abdominal   Peds  Hematology   Anesthesia Other Findings   Reproductive/Obstetrics                            Anesthesia Physical Anesthesia Plan  ASA: II  Anesthesia Plan: General   Post-op Pain Management:    Induction: Intravenous  PONV Risk Score and Plan: 2 and Ondansetron, Dexamethasone, Midazolam and Treatment may vary due to age or medical condition  Airway Management Planned: LMA  Additional Equipment:   Intra-op Plan:   Post-operative Plan:   Informed Consent: I have reviewed the patients History and Physical, chart, labs and discussed the procedure including the risks, benefits and alternatives for the proposed anesthesia with the patient or authorized representative who has indicated his/her understanding and acceptance.     Plan Discussed with: CRNA, Anesthesiologist and Surgeon  Anesthesia Plan Comments:         Anesthesia Quick Evaluation

## 2017-02-16 NOTE — Op Note (Signed)
See full [phone]  Status post irrigation debridement and repair dorsal sensory branch ulnar nerve as well as neuro lysis of the volar structures  Discharge medicines Keflex 500 4 times a day 7 days, OxyIR dispense #30 one to 2 every 4-6 hours when necessary pain by mouth 5 mg tablet  [place] [facility] [place] suture removal in 12-14 days and gentle interval range of motion  Roylee Chaffin M.D.

## 2017-02-16 NOTE — Anesthesia Procedure Notes (Signed)
Procedure Name: Intubation Date/Time: 02/16/2017 12:16 AM Performed by: Edmonia CaprioAUSTON, Ameriah Lint M Pre-anesthesia Checklist: Patient identified, Emergency Drugs available, Patient being monitored, Timeout performed and Suction available Patient Re-evaluated:Patient Re-evaluated prior to induction Oxygen Delivery Method: Circle system utilized Preoxygenation: Pre-oxygenation with 100% oxygen Induction Type: IV induction Ventilation: Mask ventilation without difficulty Laryngoscope Size: Miller and 2 Grade View: Grade I Tube type: Oral Tube size: 7.5 mm Number of attempts: 1 Airway Equipment and Method: Stylet Placement Confirmation: ETT inserted through vocal cords under direct vision,  positive ETCO2 and breath sounds checked- equal and bilateral Secured at: 22 cm Tube secured with: Tape Dental Injury: Teeth and Oropharynx as per pre-operative assessment
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
## 2017-02-17 NOTE — Anesthesia Postprocedure Evaluation (Signed)
Anesthesia Post Note  Patient: Anthony K Dicocco Sr.  Procedure(s) Performed: IRRIGATION AND DEBRIDEMENT EXTREMITY, AND REPAIR, LACERATION LEFT HAND (Left Hand)     Patient location during evaluation: PACU Anesthesia Type: General Level of consciousness: awake and alert Pain management: pain level controlled Vital Signs Assessment: post-procedure vital signs reviewed and stable Respiratory status: spontaneous breathing, nonlabored ventilation, respiratory function stable and patient connected to nasal cannula oxygen Cardiovascular status: blood pressure returned to baseline and stable Postop Assessment: no apparent nausea or vomiting Anesthetic complications: no    Last Vitals:  Vitals:   02/16/17 0126 02/16/17 0145  BP: 126/76   Pulse:    Resp: 19   Temp: 36.9 C 36.9 C  SpO2:      Last Pain:  Vitals:   02/16/17 0154  TempSrc:   PainSc: 9                  [name]

## 2017-02-18 NOTE — OR Nursing (Signed)
   Times changed in Event log to correctly document length of stay. Patient actually left at 0140, this was the Saturday night/sunday morning when the time changed back to [place] Time. This is the original documentation.

## 2017-02-20 ENCOUNTER — Encounter (HOSPITAL_COMMUNITY): Payer: Self-pay | Admitting: Orthopedic Surgery

## 2017-02-26 ENCOUNTER — Encounter (HOSPITAL_COMMUNITY): Payer: Self-pay | Admitting: Orthopedic Surgery
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
